# Patient Record
Sex: Female | Born: 1975 | Hispanic: Yes | Marital: Married
Health system: Southern US, Community
[De-identification: ages and names within clinical notes are randomized; demographics above are authoritative.]

---

## 2015-06-19 ENCOUNTER — Ambulatory Visit (INDEPENDENT_AMBULATORY_CARE_PROVIDER_SITE_OTHER): Payer: Self-pay

## 2015-06-19 ENCOUNTER — Ambulatory Visit (INDEPENDENT_AMBULATORY_CARE_PROVIDER_SITE_OTHER): Payer: Self-pay | Admitting: Sports Medicine

## 2015-06-19 VITALS — BP 125/80 | HR 72 | Resp 18 | Wt 182.7 lb

## 2015-06-19 DIAGNOSIS — M25511 Pain in right shoulder: Secondary | ICD-10-CM

## 2015-06-19 DIAGNOSIS — M5030 Other cervical disc degeneration, unspecified cervical region: Secondary | ICD-10-CM

## 2015-06-19 MED ORDER — MELOXICAM 15 MG PO TABS: ORAL_TABLET | ORAL | Status: AC

## 2015-06-19 NOTE — Assessment & Plan Note (Signed)
Noted on images brought by patient. This is not her pain generator.

## 2015-06-19 NOTE — Assessment & Plan Note (Signed)
Pain is predominantly rotator cuff and impingement related, referable to the infraspinatus. Subacromial injection as above, x-rays, meloxicam, home rehabilitation exercises. Return to see me in one month, MRI if no better.

## 2015-06-19 NOTE — Progress Notes (Signed)
   Subjective:    I'm seeing this patient as a consultation for:  Dr. Laurence Alyorbett  CC: right shoulder pain  HPI: This is a pleasant 40 year old female, she's been treated for right shoulder pain with a focus on cervical radiculopathy for some time, including further point injections, steroids, muscle relaxers, formal, chiropractic care. She did talk to a spine surgeon who told her that she was not an operative candidate. Shoulder has not yet been investigated. She works as a Interior and spatial designerhairdresser. Pain is worst over the deltoid, worse with overhead activities and repetitive motion. No paresthesias, numbness, or tingling, minimal neck pain.  Past medical history, Surgical history, Family history not pertinant except as noted below, Social history, Allergies, and medications have been entered into the medical record, reviewed, and no changes needed.   Review of Systems: No headache, visual changes, nausea, vomiting, diarrhea, constipation, dizziness, abdominal pain, skin rash, fevers, chills, night sweats, weight loss, swollen lymph nodes, body aches, joint swelling, muscle aches, chest pain, shortness of breath, mood changes, visual or auditory hallucinations.   Objective:   General: Well Developed, well nourished, and in no acute distress.  Neuro/Psych: Alert and oriented x3, extra-ocular muscles intact, able to move all 4 extremities, sensation grossly intact. Skin: Warm and dry, no rashes noted.  Respiratory: Not using accessory muscles, speaking in full sentences, trachea midline.  Cardiovascular: Pulses palpable, no extremity edema. Abdomen: Does not appear distended. Neck: Negative spurling's Full neck range of motion Grip strength and sensation normal in bilateral hands Strength good C4 to T1 distribution No sensory change to C4 to T1 Reflexes normal Right Shoulder: Inspection reveals no abnormalities, atrophy or asymmetry. Palpation is normal with no tenderness over AC joint or bicipital  groove. ROM is full in all planes. Rotator cuff strength weak to external rotation with reproduction of pain positive Neer and Hawkin's tests, empty can. Speeds and Yergason's tests normal. No labral pathology noted with negative Obrien's, negative crank, negative clunk, and good stability. Normal scapular function observed. No painful arc and no drop arm sign. No apprehension sign  Procedure: Real-time Ultrasound Guided Injection of Right subacromial bursa Device: GE Logiq E  Verbal informed consent obtained.  Time-out conducted.  Noted no overlying erythema, induration, or other signs of local infection.  Skin prepped in a sterile fashion.  Local anesthesia: Topical Ethyl chloride.  With sterile technique and under real time ultrasound guidance:  1 mL Kenalog 40, 1 mL lidocaine, 1 mL Marcaine injected easily into the subacromial bursa Completed without difficulty  Pain immediately resolved suggesting accurate placement of the medication.  Advised to call if fevers/chills, erythema, induration, drainage, or persistent bleeding.  Images permanently stored and available for review in the ultrasound unit.  Impression: Technically successful ultrasound guided injection.  Impression and Recommendations:   This case required medical decision making of moderate complexity.

## 2015-07-17 ENCOUNTER — Ambulatory Visit (INDEPENDENT_AMBULATORY_CARE_PROVIDER_SITE_OTHER): Payer: Self-pay | Admitting: Sports Medicine

## 2015-07-17 VITALS — BP 125/76 | HR 82 | Resp 18 | Wt 181.5 lb

## 2015-07-17 DIAGNOSIS — M25511 Pain in right shoulder: Secondary | ICD-10-CM

## 2015-07-17 NOTE — Progress Notes (Signed)
  Subjective:    CC: Follow-up  HPI: Right shoulder pain: Improved significantly after subacromial injection, still has some difficulty with raising her arms up, when cutting the top of the head. She has not yet tried ergonomic adjustments such as standing on a stool. Symptoms are mild, improving.  Past medical history, Surgical history, Family history not pertinant except as noted below, Social history, Allergies, and medications have been entered into the medical record, reviewed, and no changes needed.   Review of Systems: No fevers, chills, night sweats, weight loss, chest pain, or shortness of breath.   Objective:    General: Well Developed, well nourished, and in no acute distress.  Neuro: Alert and oriented x3, extra-ocular muscles intact, sensation grossly intact.  HEENT: Normocephalic, atraumatic, pupils equal round reactive to light, neck supple, no masses, no lymphadenopathy, thyroid nonpalpable.  Skin: Warm and dry, no rashes. Cardiac: Regular rate and rhythm, no murmurs rubs or gallops, no lower extremity edema.  Respiratory: Clear to auscultation bilaterally. Not using accessory muscles, speaking in full sentences. Right Shoulder: Inspection reveals no abnormalities, atrophy or asymmetry. Palpation is normal with no tenderness over AC joint or bicipital groove. ROM is full in all planes. Rotator cuff strength normal throughout. No signs of impingement with negative Neer and Hawkin's tests, empty can. Speeds and Yergason's tests normal. No labral pathology noted with negative Obrien's, negative crank, negative clunk, and good stability. Normal scapular function observed. No painful arc and no drop arm sign. No apprehension sign  Impression and Recommendations:

## 2015-07-17 NOTE — Assessment & Plan Note (Signed)
Subacromial bursitis is improved significantly with injection. 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 Return as needed.

## 2017-11-09 IMAGING — CR DG SHOULDER 2+V*R*
3 series · 3 of 3 positions shown · non-contrast
Comparison: None.

CLINICAL DATA: Right shoulder pain for months, evaluate for
tendinitis.

EXAM:
RIGHT SHOULDER - 2+ VIEW

[shoulder grashey]
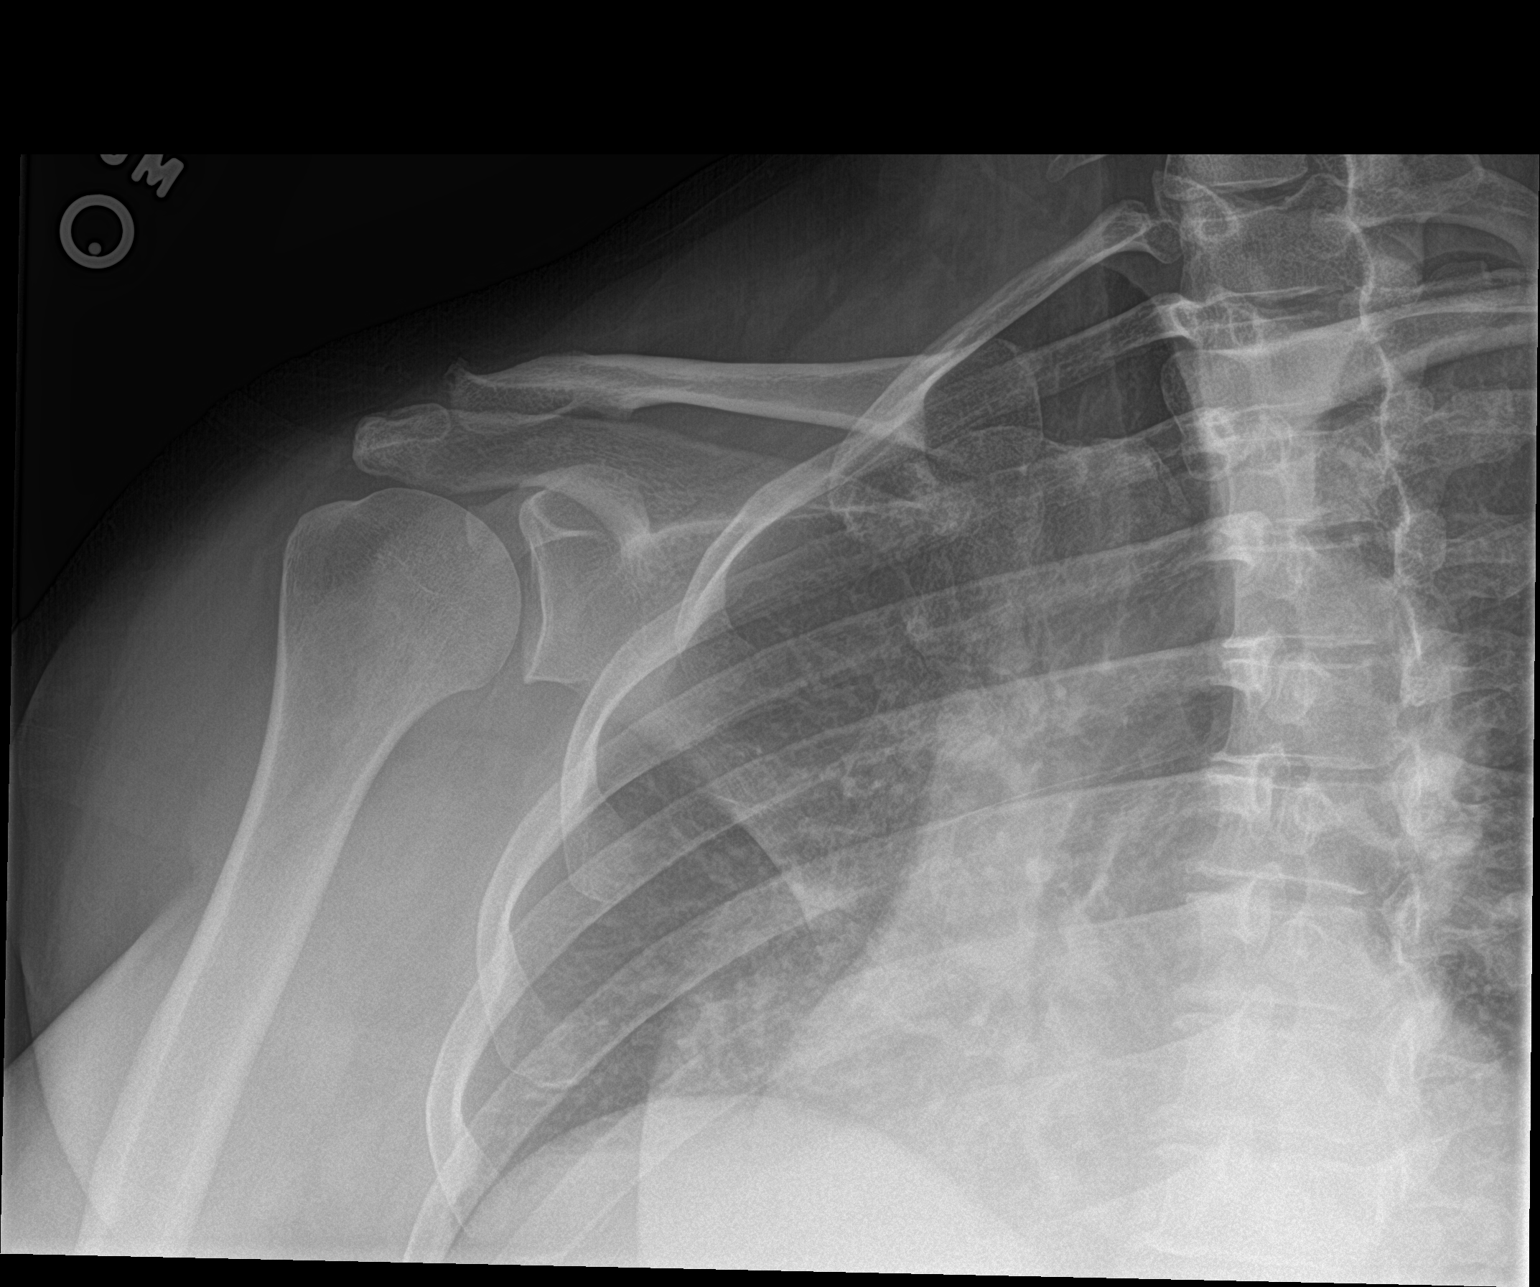

[shoulder y view]
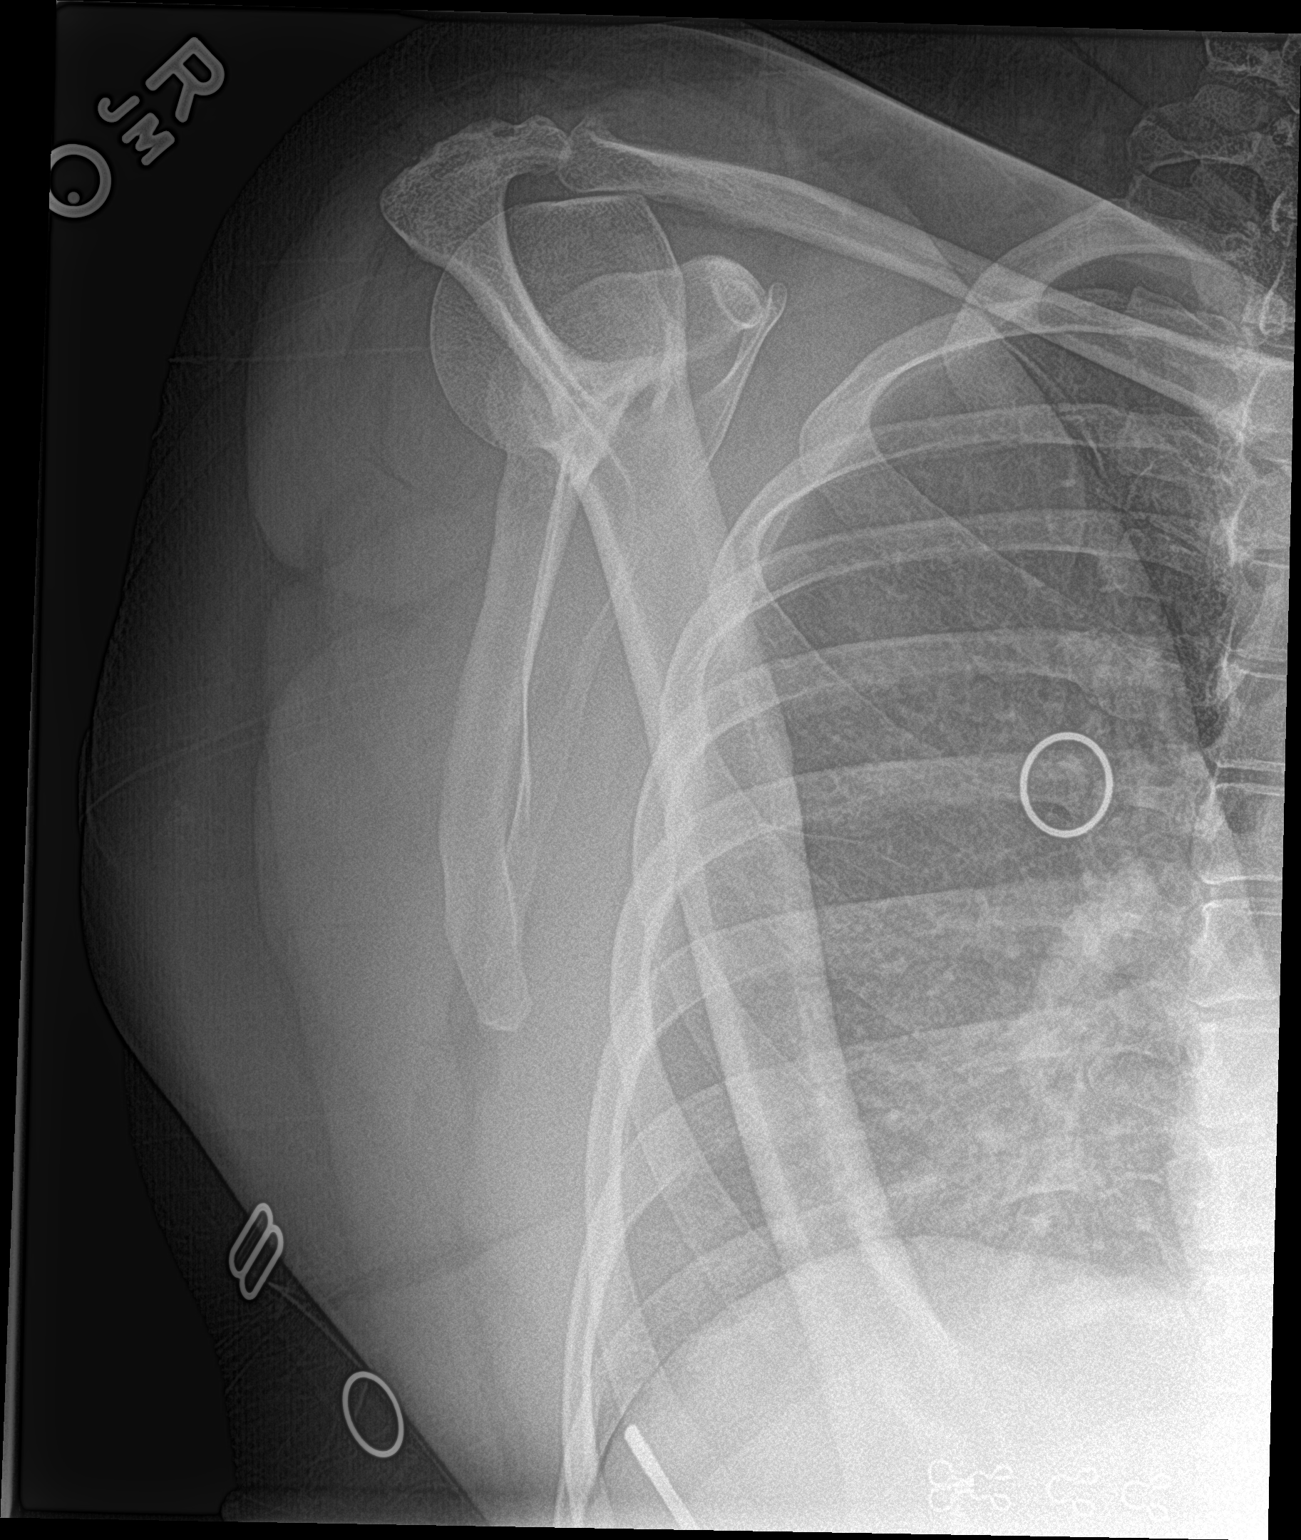

[shoulder axillary]
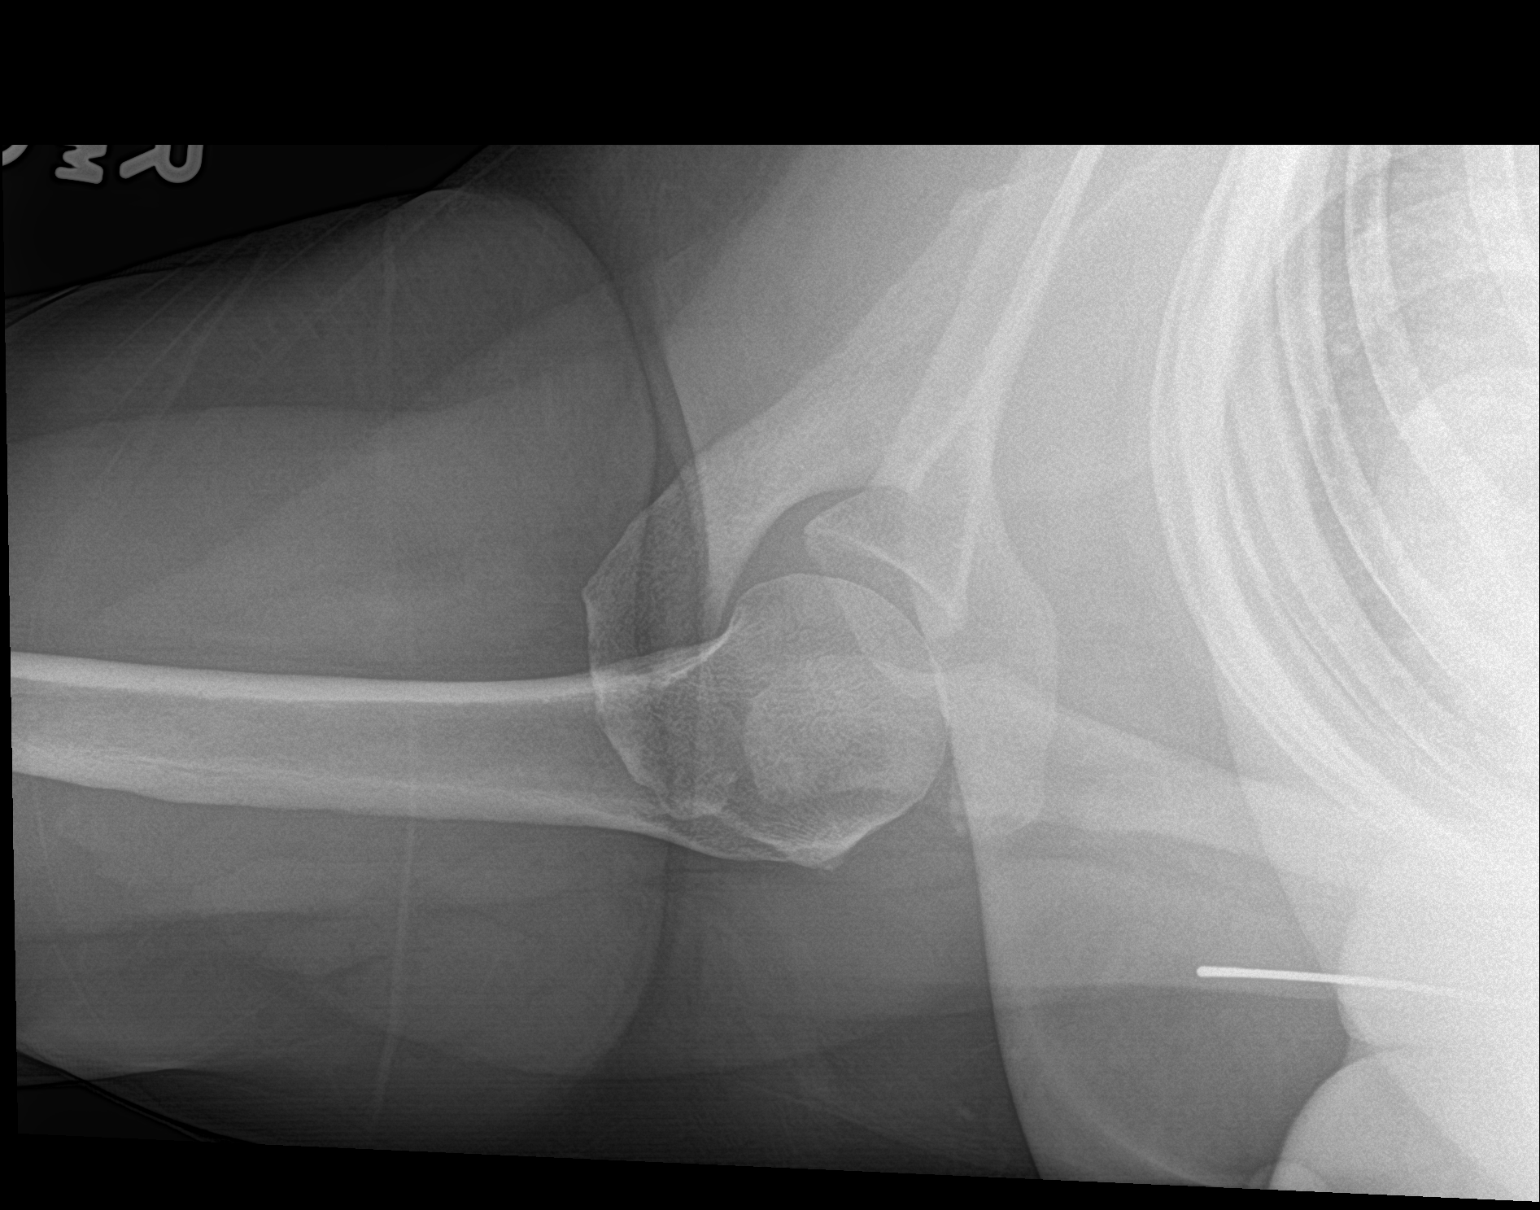

[3 of 3 positions shown; findings below may reference images not displayed]

FINDINGS: No fracture deformity or subluxation. Mild superior
acromioclavicular spurring without narrowing. No glenohumeral
degenerative change. No calcific tendinitis.
IMPRESSION: No acute finding or soft tissue calcification.

AC joint mild upward spurring.
